# Patient Record
Sex: Female | Born: 1970
Health system: Southern US, Community
[De-identification: ages and names within clinical notes are randomized; demographics above are authoritative.]

## PROBLEM LIST (undated history)

## (undated) DIAGNOSIS — M1990 Unspecified osteoarthritis, unspecified site: Secondary | ICD-10-CM

---

## 2015-05-20 DIAGNOSIS — N649 Disorder of breast, unspecified: Secondary | ICD-10-CM | POA: Diagnosis not present

## 2015-05-20 DIAGNOSIS — Z3040 Encounter for surveillance of contraceptives, unspecified: Secondary | ICD-10-CM | POA: Diagnosis not present

## 2015-06-04 DIAGNOSIS — N6011 Diffuse cystic mastopathy of right breast: Secondary | ICD-10-CM | POA: Diagnosis not present

## 2015-06-04 DIAGNOSIS — N6012 Diffuse cystic mastopathy of left breast: Secondary | ICD-10-CM | POA: Diagnosis not present

## 2015-06-04 DIAGNOSIS — R922 Inconclusive mammogram: Secondary | ICD-10-CM | POA: Diagnosis not present

## 2015-06-24 DIAGNOSIS — M0589 Other rheumatoid arthritis with rheumatoid factor of multiple sites: Secondary | ICD-10-CM | POA: Diagnosis not present

## 2015-08-07 DIAGNOSIS — R42 Dizziness and giddiness: Secondary | ICD-10-CM | POA: Diagnosis not present

## 2015-08-07 DIAGNOSIS — R5383 Other fatigue: Secondary | ICD-10-CM | POA: Diagnosis not present

## 2015-08-07 DIAGNOSIS — R030 Elevated blood-pressure reading, without diagnosis of hypertension: Secondary | ICD-10-CM | POA: Diagnosis not present

## 2015-08-14 DIAGNOSIS — Z124 Encounter for screening for malignant neoplasm of cervix: Secondary | ICD-10-CM | POA: Diagnosis not present

## 2015-08-14 DIAGNOSIS — Z6826 Body mass index (BMI) 26.0-26.9, adult: Secondary | ICD-10-CM | POA: Diagnosis not present

## 2015-08-14 DIAGNOSIS — Z01419 Encounter for gynecological examination (general) (routine) without abnormal findings: Secondary | ICD-10-CM | POA: Diagnosis not present

## 2015-08-20 DIAGNOSIS — M0589 Other rheumatoid arthritis with rheumatoid factor of multiple sites: Secondary | ICD-10-CM | POA: Diagnosis not present

## 2015-08-27 DIAGNOSIS — M0579 Rheumatoid arthritis with rheumatoid factor of multiple sites without organ or systems involvement: Secondary | ICD-10-CM | POA: Diagnosis not present

## 2015-08-27 DIAGNOSIS — M7989 Other specified soft tissue disorders: Secondary | ICD-10-CM | POA: Diagnosis not present

## 2015-08-27 DIAGNOSIS — Z79899 Other long term (current) drug therapy: Secondary | ICD-10-CM | POA: Diagnosis not present

## 2015-09-11 DIAGNOSIS — R5383 Other fatigue: Secondary | ICD-10-CM | POA: Diagnosis not present

## 2015-10-07 DIAGNOSIS — R030 Elevated blood-pressure reading, without diagnosis of hypertension: Secondary | ICD-10-CM | POA: Diagnosis not present

## 2015-10-15 DIAGNOSIS — M0589 Other rheumatoid arthritis with rheumatoid factor of multiple sites: Secondary | ICD-10-CM | POA: Diagnosis not present

## 2015-10-16 DIAGNOSIS — Z79899 Other long term (current) drug therapy: Secondary | ICD-10-CM | POA: Diagnosis not present

## 2015-12-11 DIAGNOSIS — M0579 Rheumatoid arthritis with rheumatoid factor of multiple sites without organ or systems involvement: Secondary | ICD-10-CM | POA: Diagnosis not present

## 2015-12-11 DIAGNOSIS — Z79899 Other long term (current) drug therapy: Secondary | ICD-10-CM | POA: Diagnosis not present

## 2015-12-11 DIAGNOSIS — Z23 Encounter for immunization: Secondary | ICD-10-CM | POA: Diagnosis not present

## 2015-12-11 DIAGNOSIS — M7989 Other specified soft tissue disorders: Secondary | ICD-10-CM | POA: Diagnosis not present

## 2015-12-16 DIAGNOSIS — M0589 Other rheumatoid arthritis with rheumatoid factor of multiple sites: Secondary | ICD-10-CM | POA: Diagnosis not present

## 2015-12-16 DIAGNOSIS — Z79899 Other long term (current) drug therapy: Secondary | ICD-10-CM | POA: Diagnosis not present

## 2016-02-13 DIAGNOSIS — Z79899 Other long term (current) drug therapy: Secondary | ICD-10-CM | POA: Diagnosis not present

## 2016-02-13 DIAGNOSIS — M0589 Other rheumatoid arthritis with rheumatoid factor of multiple sites: Secondary | ICD-10-CM | POA: Diagnosis not present

## 2016-03-18 DIAGNOSIS — M0579 Rheumatoid arthritis with rheumatoid factor of multiple sites without organ or systems involvement: Secondary | ICD-10-CM | POA: Diagnosis not present

## 2016-03-18 DIAGNOSIS — Z79899 Other long term (current) drug therapy: Secondary | ICD-10-CM | POA: Diagnosis not present

## 2016-04-07 DIAGNOSIS — Z6826 Body mass index (BMI) 26.0-26.9, adult: Secondary | ICD-10-CM | POA: Diagnosis not present

## 2016-04-07 DIAGNOSIS — N760 Acute vaginitis: Secondary | ICD-10-CM | POA: Diagnosis not present

## 2016-04-13 DIAGNOSIS — Z79899 Other long term (current) drug therapy: Secondary | ICD-10-CM | POA: Diagnosis not present

## 2016-04-13 DIAGNOSIS — M0589 Other rheumatoid arthritis with rheumatoid factor of multiple sites: Secondary | ICD-10-CM | POA: Diagnosis not present

## 2016-04-20 DIAGNOSIS — Z0000 Encounter for general adult medical examination without abnormal findings: Secondary | ICD-10-CM | POA: Diagnosis not present

## 2016-04-20 DIAGNOSIS — M0589 Other rheumatoid arthritis with rheumatoid factor of multiple sites: Secondary | ICD-10-CM | POA: Diagnosis not present

## 2016-05-04 DIAGNOSIS — L309 Dermatitis, unspecified: Secondary | ICD-10-CM | POA: Diagnosis not present

## 2016-05-05 DIAGNOSIS — Z0000 Encounter for general adult medical examination without abnormal findings: Secondary | ICD-10-CM | POA: Diagnosis not present

## 2016-05-05 DIAGNOSIS — Z131 Encounter for screening for diabetes mellitus: Secondary | ICD-10-CM | POA: Diagnosis not present

## 2016-05-05 DIAGNOSIS — Z13220 Encounter for screening for lipoid disorders: Secondary | ICD-10-CM | POA: Diagnosis not present

## 2016-05-26 DIAGNOSIS — I8311 Varicose veins of right lower extremity with inflammation: Secondary | ICD-10-CM | POA: Diagnosis not present

## 2016-05-26 DIAGNOSIS — I8312 Varicose veins of left lower extremity with inflammation: Secondary | ICD-10-CM | POA: Diagnosis not present

## 2016-06-11 DIAGNOSIS — M0589 Other rheumatoid arthritis with rheumatoid factor of multiple sites: Secondary | ICD-10-CM | POA: Diagnosis not present

## 2016-06-22 DIAGNOSIS — I8311 Varicose veins of right lower extremity with inflammation: Secondary | ICD-10-CM | POA: Diagnosis not present

## 2016-06-22 DIAGNOSIS — I8312 Varicose veins of left lower extremity with inflammation: Secondary | ICD-10-CM | POA: Diagnosis not present

## 2016-07-07 DIAGNOSIS — I8311 Varicose veins of right lower extremity with inflammation: Secondary | ICD-10-CM | POA: Diagnosis not present

## 2016-07-07 DIAGNOSIS — I8312 Varicose veins of left lower extremity with inflammation: Secondary | ICD-10-CM | POA: Diagnosis not present

## 2016-07-27 DIAGNOSIS — M0579 Rheumatoid arthritis with rheumatoid factor of multiple sites without organ or systems involvement: Secondary | ICD-10-CM | POA: Diagnosis not present

## 2016-07-27 DIAGNOSIS — Z79899 Other long term (current) drug therapy: Secondary | ICD-10-CM | POA: Diagnosis not present

## 2016-07-27 DIAGNOSIS — M7989 Other specified soft tissue disorders: Secondary | ICD-10-CM | POA: Diagnosis not present

## 2016-08-26 DIAGNOSIS — Z79899 Other long term (current) drug therapy: Secondary | ICD-10-CM | POA: Diagnosis not present

## 2016-08-26 DIAGNOSIS — M0589 Other rheumatoid arthritis with rheumatoid factor of multiple sites: Secondary | ICD-10-CM | POA: Diagnosis not present

## 2016-09-14 DIAGNOSIS — Z6825 Body mass index (BMI) 25.0-25.9, adult: Secondary | ICD-10-CM | POA: Diagnosis not present

## 2016-09-14 DIAGNOSIS — Z124 Encounter for screening for malignant neoplasm of cervix: Secondary | ICD-10-CM | POA: Diagnosis not present

## 2016-09-14 DIAGNOSIS — Z01419 Encounter for gynecological examination (general) (routine) without abnormal findings: Secondary | ICD-10-CM | POA: Diagnosis not present

## 2016-10-06 DIAGNOSIS — Z1231 Encounter for screening mammogram for malignant neoplasm of breast: Secondary | ICD-10-CM | POA: Diagnosis not present

## 2016-10-14 DIAGNOSIS — L309 Dermatitis, unspecified: Secondary | ICD-10-CM | POA: Diagnosis not present

## 2016-10-14 DIAGNOSIS — Z23 Encounter for immunization: Secondary | ICD-10-CM | POA: Diagnosis not present

## 2016-10-14 DIAGNOSIS — L700 Acne vulgaris: Secondary | ICD-10-CM | POA: Diagnosis not present

## 2016-10-14 DIAGNOSIS — L74513 Primary focal hyperhidrosis, soles: Secondary | ICD-10-CM | POA: Diagnosis not present

## 2016-10-26 DIAGNOSIS — Z79899 Other long term (current) drug therapy: Secondary | ICD-10-CM | POA: Diagnosis not present

## 2016-10-26 DIAGNOSIS — M0579 Rheumatoid arthritis with rheumatoid factor of multiple sites without organ or systems involvement: Secondary | ICD-10-CM | POA: Diagnosis not present

## 2016-10-27 DIAGNOSIS — M0589 Other rheumatoid arthritis with rheumatoid factor of multiple sites: Secondary | ICD-10-CM | POA: Diagnosis not present

## 2016-10-27 DIAGNOSIS — Z79899 Other long term (current) drug therapy: Secondary | ICD-10-CM | POA: Diagnosis not present

## 2016-11-10 DIAGNOSIS — Z800 Family history of malignant neoplasm of digestive organs: Secondary | ICD-10-CM | POA: Diagnosis not present

## 2016-11-10 DIAGNOSIS — Z1211 Encounter for screening for malignant neoplasm of colon: Secondary | ICD-10-CM | POA: Diagnosis not present

## 2016-11-10 DIAGNOSIS — K5904 Chronic idiopathic constipation: Secondary | ICD-10-CM | POA: Diagnosis not present

## 2016-12-14 DIAGNOSIS — Z1211 Encounter for screening for malignant neoplasm of colon: Secondary | ICD-10-CM | POA: Diagnosis not present

## 2016-12-22 DIAGNOSIS — Z79899 Other long term (current) drug therapy: Secondary | ICD-10-CM | POA: Diagnosis not present

## 2016-12-22 DIAGNOSIS — M0589 Other rheumatoid arthritis with rheumatoid factor of multiple sites: Secondary | ICD-10-CM | POA: Diagnosis not present

## 2017-02-16 DIAGNOSIS — M0589 Other rheumatoid arthritis with rheumatoid factor of multiple sites: Secondary | ICD-10-CM | POA: Diagnosis not present

## 2017-02-16 DIAGNOSIS — Z79899 Other long term (current) drug therapy: Secondary | ICD-10-CM | POA: Diagnosis not present

## 2017-02-22 DIAGNOSIS — M0579 Rheumatoid arthritis with rheumatoid factor of multiple sites without organ or systems involvement: Secondary | ICD-10-CM | POA: Diagnosis not present

## 2017-02-22 DIAGNOSIS — Z79899 Other long term (current) drug therapy: Secondary | ICD-10-CM | POA: Diagnosis not present

## 2017-03-29 DIAGNOSIS — M65312 Trigger thumb, left thumb: Secondary | ICD-10-CM | POA: Diagnosis not present

## 2017-03-29 DIAGNOSIS — M79645 Pain in left finger(s): Secondary | ICD-10-CM | POA: Diagnosis not present

## 2017-04-12 DIAGNOSIS — M0589 Other rheumatoid arthritis with rheumatoid factor of multiple sites: Secondary | ICD-10-CM | POA: Diagnosis not present

## 2017-05-10 DIAGNOSIS — Z0000 Encounter for general adult medical examination without abnormal findings: Secondary | ICD-10-CM | POA: Diagnosis not present

## 2017-05-17 DIAGNOSIS — Z0000 Encounter for general adult medical examination without abnormal findings: Secondary | ICD-10-CM | POA: Diagnosis not present

## 2017-05-17 DIAGNOSIS — M0589 Other rheumatoid arthritis with rheumatoid factor of multiple sites: Secondary | ICD-10-CM | POA: Diagnosis not present

## 2017-05-17 DIAGNOSIS — M79674 Pain in right toe(s): Secondary | ICD-10-CM | POA: Diagnosis not present

## 2017-05-26 DIAGNOSIS — M79671 Pain in right foot: Secondary | ICD-10-CM | POA: Diagnosis not present

## 2017-05-26 DIAGNOSIS — M79672 Pain in left foot: Secondary | ICD-10-CM | POA: Diagnosis not present

## 2017-05-26 DIAGNOSIS — M2041 Other hammer toe(s) (acquired), right foot: Secondary | ICD-10-CM | POA: Diagnosis not present

## 2017-05-26 DIAGNOSIS — M2042 Other hammer toe(s) (acquired), left foot: Secondary | ICD-10-CM | POA: Diagnosis not present

## 2017-06-07 DIAGNOSIS — M0589 Other rheumatoid arthritis with rheumatoid factor of multiple sites: Secondary | ICD-10-CM | POA: Diagnosis not present

## 2017-06-07 DIAGNOSIS — Z79899 Other long term (current) drug therapy: Secondary | ICD-10-CM | POA: Diagnosis not present

## 2017-06-22 DIAGNOSIS — M0579 Rheumatoid arthritis with rheumatoid factor of multiple sites without organ or systems involvement: Secondary | ICD-10-CM | POA: Diagnosis not present

## 2017-06-22 DIAGNOSIS — Z79899 Other long term (current) drug therapy: Secondary | ICD-10-CM | POA: Diagnosis not present

## 2017-06-22 DIAGNOSIS — M24542 Contracture, left hand: Secondary | ICD-10-CM | POA: Diagnosis not present

## 2017-08-02 DIAGNOSIS — Z01419 Encounter for gynecological examination (general) (routine) without abnormal findings: Secondary | ICD-10-CM | POA: Diagnosis not present

## 2017-08-02 DIAGNOSIS — Z124 Encounter for screening for malignant neoplasm of cervix: Secondary | ICD-10-CM | POA: Diagnosis not present

## 2017-08-03 DIAGNOSIS — M0589 Other rheumatoid arthritis with rheumatoid factor of multiple sites: Secondary | ICD-10-CM | POA: Diagnosis not present

## 2017-08-03 DIAGNOSIS — Z79899 Other long term (current) drug therapy: Secondary | ICD-10-CM | POA: Diagnosis not present

## 2017-09-03 DIAGNOSIS — M79671 Pain in right foot: Secondary | ICD-10-CM | POA: Diagnosis not present

## 2017-09-28 DIAGNOSIS — Z79899 Other long term (current) drug therapy: Secondary | ICD-10-CM | POA: Diagnosis not present

## 2017-09-28 DIAGNOSIS — M0589 Other rheumatoid arthritis with rheumatoid factor of multiple sites: Secondary | ICD-10-CM | POA: Diagnosis not present

## 2017-11-01 DIAGNOSIS — Z23 Encounter for immunization: Secondary | ICD-10-CM | POA: Diagnosis not present

## 2017-11-01 DIAGNOSIS — M0579 Rheumatoid arthritis with rheumatoid factor of multiple sites without organ or systems involvement: Secondary | ICD-10-CM | POA: Diagnosis not present

## 2017-11-01 DIAGNOSIS — M24542 Contracture, left hand: Secondary | ICD-10-CM | POA: Diagnosis not present

## 2017-11-01 DIAGNOSIS — S6992XA Unspecified injury of left wrist, hand and finger(s), initial encounter: Secondary | ICD-10-CM | POA: Diagnosis not present

## 2017-11-25 DIAGNOSIS — M0589 Other rheumatoid arthritis with rheumatoid factor of multiple sites: Secondary | ICD-10-CM | POA: Diagnosis not present

## 2018-01-21 DIAGNOSIS — Z79899 Other long term (current) drug therapy: Secondary | ICD-10-CM | POA: Diagnosis not present

## 2018-01-21 DIAGNOSIS — M0589 Other rheumatoid arthritis with rheumatoid factor of multiple sites: Secondary | ICD-10-CM | POA: Diagnosis not present

## 2018-01-26 DIAGNOSIS — I8312 Varicose veins of left lower extremity with inflammation: Secondary | ICD-10-CM | POA: Diagnosis not present

## 2018-01-26 DIAGNOSIS — I8311 Varicose veins of right lower extremity with inflammation: Secondary | ICD-10-CM | POA: Diagnosis not present

## 2018-03-08 DIAGNOSIS — I8312 Varicose veins of left lower extremity with inflammation: Secondary | ICD-10-CM | POA: Diagnosis not present

## 2018-03-23 DIAGNOSIS — M0589 Other rheumatoid arthritis with rheumatoid factor of multiple sites: Secondary | ICD-10-CM | POA: Diagnosis not present

## 2018-04-22 DIAGNOSIS — M0579 Rheumatoid arthritis with rheumatoid factor of multiple sites without organ or systems involvement: Secondary | ICD-10-CM | POA: Diagnosis not present

## 2018-04-22 DIAGNOSIS — R5383 Other fatigue: Secondary | ICD-10-CM | POA: Diagnosis not present

## 2018-05-18 DIAGNOSIS — M0579 Rheumatoid arthritis with rheumatoid factor of multiple sites without organ or systems involvement: Secondary | ICD-10-CM | POA: Diagnosis not present

## 2018-05-18 DIAGNOSIS — Z79899 Other long term (current) drug therapy: Secondary | ICD-10-CM | POA: Diagnosis not present

## 2018-07-20 DIAGNOSIS — Z79899 Other long term (current) drug therapy: Secondary | ICD-10-CM | POA: Diagnosis not present

## 2018-07-20 DIAGNOSIS — M0589 Other rheumatoid arthritis with rheumatoid factor of multiple sites: Secondary | ICD-10-CM | POA: Diagnosis not present

## 2018-07-26 DIAGNOSIS — Z0000 Encounter for general adult medical examination without abnormal findings: Secondary | ICD-10-CM | POA: Diagnosis not present

## 2018-08-16 DIAGNOSIS — M0589 Other rheumatoid arthritis with rheumatoid factor of multiple sites: Secondary | ICD-10-CM | POA: Diagnosis not present

## 2018-08-16 DIAGNOSIS — Z7189 Other specified counseling: Secondary | ICD-10-CM | POA: Diagnosis not present

## 2018-08-16 DIAGNOSIS — R3915 Urgency of urination: Secondary | ICD-10-CM | POA: Diagnosis not present

## 2018-08-16 DIAGNOSIS — Z0000 Encounter for general adult medical examination without abnormal findings: Secondary | ICD-10-CM | POA: Diagnosis not present

## 2018-09-14 DIAGNOSIS — Z79899 Other long term (current) drug therapy: Secondary | ICD-10-CM | POA: Diagnosis not present

## 2018-09-14 DIAGNOSIS — M0589 Other rheumatoid arthritis with rheumatoid factor of multiple sites: Secondary | ICD-10-CM | POA: Diagnosis not present

## 2018-10-26 DIAGNOSIS — Z6826 Body mass index (BMI) 26.0-26.9, adult: Secondary | ICD-10-CM | POA: Diagnosis not present

## 2018-10-26 DIAGNOSIS — Z20828 Contact with and (suspected) exposure to other viral communicable diseases: Secondary | ICD-10-CM | POA: Diagnosis not present

## 2018-11-09 DIAGNOSIS — Z79899 Other long term (current) drug therapy: Secondary | ICD-10-CM | POA: Diagnosis not present

## 2018-11-09 DIAGNOSIS — M0589 Other rheumatoid arthritis with rheumatoid factor of multiple sites: Secondary | ICD-10-CM | POA: Diagnosis not present

## 2018-11-16 DIAGNOSIS — Z79899 Other long term (current) drug therapy: Secondary | ICD-10-CM | POA: Diagnosis not present

## 2018-11-16 DIAGNOSIS — M24542 Contracture, left hand: Secondary | ICD-10-CM | POA: Diagnosis not present

## 2018-11-16 DIAGNOSIS — Z23 Encounter for immunization: Secondary | ICD-10-CM | POA: Diagnosis not present

## 2018-11-16 DIAGNOSIS — M0579 Rheumatoid arthritis with rheumatoid factor of multiple sites without organ or systems involvement: Secondary | ICD-10-CM | POA: Diagnosis not present

## 2018-11-16 DIAGNOSIS — R5383 Other fatigue: Secondary | ICD-10-CM | POA: Diagnosis not present

## 2019-01-02 DIAGNOSIS — Z6827 Body mass index (BMI) 27.0-27.9, adult: Secondary | ICD-10-CM | POA: Diagnosis not present

## 2019-01-02 DIAGNOSIS — Z20828 Contact with and (suspected) exposure to other viral communicable diseases: Secondary | ICD-10-CM | POA: Diagnosis not present

## 2019-01-04 DIAGNOSIS — Z20828 Contact with and (suspected) exposure to other viral communicable diseases: Secondary | ICD-10-CM | POA: Diagnosis not present

## 2019-01-04 DIAGNOSIS — Z79899 Other long term (current) drug therapy: Secondary | ICD-10-CM | POA: Diagnosis not present

## 2019-01-04 DIAGNOSIS — M0589 Other rheumatoid arthritis with rheumatoid factor of multiple sites: Secondary | ICD-10-CM | POA: Diagnosis not present

## 2019-01-04 DIAGNOSIS — Z6827 Body mass index (BMI) 27.0-27.9, adult: Secondary | ICD-10-CM | POA: Diagnosis not present

## 2019-01-20 DIAGNOSIS — Z209 Contact with and (suspected) exposure to unspecified communicable disease: Secondary | ICD-10-CM | POA: Diagnosis not present

## 2019-01-20 DIAGNOSIS — Z6827 Body mass index (BMI) 27.0-27.9, adult: Secondary | ICD-10-CM | POA: Diagnosis not present

## 2019-02-14 DIAGNOSIS — Z6827 Body mass index (BMI) 27.0-27.9, adult: Secondary | ICD-10-CM | POA: Diagnosis not present

## 2019-02-14 DIAGNOSIS — Z20828 Contact with and (suspected) exposure to other viral communicable diseases: Secondary | ICD-10-CM | POA: Diagnosis not present

## 2019-03-07 DIAGNOSIS — M0589 Other rheumatoid arthritis with rheumatoid factor of multiple sites: Secondary | ICD-10-CM | POA: Diagnosis not present

## 2019-05-02 DIAGNOSIS — M0589 Other rheumatoid arthritis with rheumatoid factor of multiple sites: Secondary | ICD-10-CM | POA: Diagnosis not present

## 2019-05-17 DIAGNOSIS — M24542 Contracture, left hand: Secondary | ICD-10-CM | POA: Diagnosis not present

## 2019-05-17 DIAGNOSIS — M0579 Rheumatoid arthritis with rheumatoid factor of multiple sites without organ or systems involvement: Secondary | ICD-10-CM | POA: Diagnosis not present

## 2019-05-22 DIAGNOSIS — M0579 Rheumatoid arthritis with rheumatoid factor of multiple sites without organ or systems involvement: Secondary | ICD-10-CM | POA: Diagnosis not present

## 2019-05-30 DIAGNOSIS — Z20822 Contact with and (suspected) exposure to covid-19: Secondary | ICD-10-CM | POA: Diagnosis not present

## 2019-06-28 DIAGNOSIS — Z79899 Other long term (current) drug therapy: Secondary | ICD-10-CM | POA: Diagnosis not present

## 2019-06-28 DIAGNOSIS — M0579 Rheumatoid arthritis with rheumatoid factor of multiple sites without organ or systems involvement: Secondary | ICD-10-CM | POA: Diagnosis not present

## 2019-07-03 DIAGNOSIS — R5383 Other fatigue: Secondary | ICD-10-CM | POA: Diagnosis not present

## 2019-07-11 DIAGNOSIS — R432 Parageusia: Secondary | ICD-10-CM | POA: Diagnosis not present

## 2019-07-11 DIAGNOSIS — R430 Anosmia: Secondary | ICD-10-CM | POA: Diagnosis not present

## 2019-07-11 DIAGNOSIS — R0981 Nasal congestion: Secondary | ICD-10-CM | POA: Diagnosis not present

## 2019-08-29 DIAGNOSIS — Z79899 Other long term (current) drug therapy: Secondary | ICD-10-CM | POA: Diagnosis not present

## 2019-08-29 DIAGNOSIS — M0579 Rheumatoid arthritis with rheumatoid factor of multiple sites without organ or systems involvement: Secondary | ICD-10-CM | POA: Diagnosis not present

## 2019-10-18 DIAGNOSIS — Z0000 Encounter for general adult medical examination without abnormal findings: Secondary | ICD-10-CM | POA: Diagnosis not present

## 2019-10-18 DIAGNOSIS — Z131 Encounter for screening for diabetes mellitus: Secondary | ICD-10-CM | POA: Diagnosis not present

## 2019-10-18 DIAGNOSIS — E875 Hyperkalemia: Secondary | ICD-10-CM | POA: Diagnosis not present

## 2019-10-20 DIAGNOSIS — E875 Hyperkalemia: Secondary | ICD-10-CM | POA: Diagnosis not present

## 2019-10-23 DIAGNOSIS — Z20828 Contact with and (suspected) exposure to other viral communicable diseases: Secondary | ICD-10-CM | POA: Diagnosis not present

## 2019-10-24 DIAGNOSIS — M0579 Rheumatoid arthritis with rheumatoid factor of multiple sites without organ or systems involvement: Secondary | ICD-10-CM | POA: Diagnosis not present

## 2019-10-24 DIAGNOSIS — Z79899 Other long term (current) drug therapy: Secondary | ICD-10-CM | POA: Diagnosis not present

## 2019-10-25 DIAGNOSIS — M7072 Other bursitis of hip, left hip: Secondary | ICD-10-CM | POA: Diagnosis not present

## 2019-10-25 DIAGNOSIS — R430 Anosmia: Secondary | ICD-10-CM | POA: Diagnosis not present

## 2019-10-25 DIAGNOSIS — I872 Venous insufficiency (chronic) (peripheral): Secondary | ICD-10-CM | POA: Diagnosis not present

## 2019-10-25 DIAGNOSIS — M0589 Other rheumatoid arthritis with rheumatoid factor of multiple sites: Secondary | ICD-10-CM | POA: Diagnosis not present

## 2019-10-25 DIAGNOSIS — Z0000 Encounter for general adult medical examination without abnormal findings: Secondary | ICD-10-CM | POA: Diagnosis not present

## 2019-11-13 DIAGNOSIS — R430 Anosmia: Secondary | ICD-10-CM | POA: Diagnosis not present

## 2019-11-13 DIAGNOSIS — R432 Parageusia: Secondary | ICD-10-CM | POA: Diagnosis not present

## 2019-11-17 DIAGNOSIS — R430 Anosmia: Secondary | ICD-10-CM

## 2019-11-20 DIAGNOSIS — M24542 Contracture, left hand: Secondary | ICD-10-CM | POA: Diagnosis not present

## 2019-11-20 DIAGNOSIS — R5383 Other fatigue: Secondary | ICD-10-CM | POA: Diagnosis not present

## 2019-11-20 DIAGNOSIS — M0579 Rheumatoid arthritis with rheumatoid factor of multiple sites without organ or systems involvement: Secondary | ICD-10-CM | POA: Diagnosis not present

## 2019-11-20 DIAGNOSIS — Z79899 Other long term (current) drug therapy: Secondary | ICD-10-CM | POA: Diagnosis not present

## 2019-12-20 DIAGNOSIS — M0589 Other rheumatoid arthritis with rheumatoid factor of multiple sites: Secondary | ICD-10-CM | POA: Diagnosis not present

## 2020-02-16 DIAGNOSIS — M0589 Other rheumatoid arthritis with rheumatoid factor of multiple sites: Secondary | ICD-10-CM | POA: Diagnosis not present

## 2020-03-04 DIAGNOSIS — T7840XA Allergy, unspecified, initial encounter: Secondary | ICD-10-CM | POA: Diagnosis not present

## 2020-03-18 DIAGNOSIS — R200 Anesthesia of skin: Secondary | ICD-10-CM | POA: Diagnosis not present

## 2020-03-18 DIAGNOSIS — B353 Tinea pedis: Secondary | ICD-10-CM | POA: Diagnosis not present

## 2020-03-18 DIAGNOSIS — M06252 Rheumatoid bursitis, left hip: Secondary | ICD-10-CM | POA: Diagnosis not present

## 2020-03-18 DIAGNOSIS — I872 Venous insufficiency (chronic) (peripheral): Secondary | ICD-10-CM | POA: Diagnosis not present

## 2020-03-26 DIAGNOSIS — S161XXA Strain of muscle, fascia and tendon at neck level, initial encounter: Secondary | ICD-10-CM

## 2020-03-26 HISTORY — DX: Unspecified osteoarthritis, unspecified site: M19.90

## 2020-03-26 NOTE — Discharge Instructions (Signed)
Naprosyn twice daily for neck pain/headache Supplement with tizanidine at home- may cause drowsiness Alternate ice and heat Follow up if not improving
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
## 2020-04-01 DIAGNOSIS — M5459 Other low back pain: Secondary | ICD-10-CM | POA: Diagnosis not present

## 2020-04-01 DIAGNOSIS — M542 Cervicalgia: Secondary | ICD-10-CM | POA: Diagnosis not present

## 2020-04-08 DIAGNOSIS — M542 Cervicalgia: Secondary | ICD-10-CM

## 2020-04-10 DIAGNOSIS — M542 Cervicalgia: Secondary | ICD-10-CM | POA: Diagnosis not present

## 2020-04-12 DIAGNOSIS — M0589 Other rheumatoid arthritis with rheumatoid factor of multiple sites: Secondary | ICD-10-CM | POA: Diagnosis not present

## 2020-04-15 DIAGNOSIS — M542 Cervicalgia: Secondary | ICD-10-CM | POA: Diagnosis not present

## 2020-04-16 DIAGNOSIS — Z20822 Contact with and (suspected) exposure to covid-19: Secondary | ICD-10-CM | POA: Diagnosis not present

## 2020-04-25 DIAGNOSIS — M542 Cervicalgia: Secondary | ICD-10-CM | POA: Diagnosis not present

## 2020-05-01 DIAGNOSIS — M542 Cervicalgia: Secondary | ICD-10-CM | POA: Diagnosis not present

## 2020-05-10 DIAGNOSIS — M542 Cervicalgia: Secondary | ICD-10-CM | POA: Diagnosis not present

## 2020-05-15 DIAGNOSIS — M542 Cervicalgia: Secondary | ICD-10-CM | POA: Diagnosis not present

## 2020-05-22 DIAGNOSIS — M542 Cervicalgia: Secondary | ICD-10-CM | POA: Diagnosis not present

## 2020-06-03 DIAGNOSIS — M542 Cervicalgia: Secondary | ICD-10-CM | POA: Diagnosis not present

## 2020-06-04 DIAGNOSIS — Z20822 Contact with and (suspected) exposure to covid-19: Secondary | ICD-10-CM | POA: Diagnosis not present

## 2020-06-06 DIAGNOSIS — M542 Cervicalgia: Secondary | ICD-10-CM | POA: Diagnosis not present

## 2020-06-10 DIAGNOSIS — M0589 Other rheumatoid arthritis with rheumatoid factor of multiple sites: Secondary | ICD-10-CM | POA: Diagnosis not present

## 2020-06-25 DIAGNOSIS — Z79899 Other long term (current) drug therapy: Secondary | ICD-10-CM | POA: Diagnosis not present

## 2020-06-25 DIAGNOSIS — R5383 Other fatigue: Secondary | ICD-10-CM | POA: Diagnosis not present

## 2020-06-25 DIAGNOSIS — M0579 Rheumatoid arthritis with rheumatoid factor of multiple sites without organ or systems involvement: Secondary | ICD-10-CM | POA: Diagnosis not present

## 2020-06-25 DIAGNOSIS — M24542 Contracture, left hand: Secondary | ICD-10-CM | POA: Diagnosis not present

## 2020-06-28 DIAGNOSIS — Z01411 Encounter for gynecological examination (general) (routine) with abnormal findings: Secondary | ICD-10-CM | POA: Diagnosis not present

## 2020-06-28 DIAGNOSIS — Z01419 Encounter for gynecological examination (general) (routine) without abnormal findings: Secondary | ICD-10-CM | POA: Diagnosis not present

## 2020-06-28 DIAGNOSIS — Z124 Encounter for screening for malignant neoplasm of cervix: Secondary | ICD-10-CM | POA: Diagnosis not present

## 2020-06-28 DIAGNOSIS — Z6826 Body mass index (BMI) 26.0-26.9, adult: Secondary | ICD-10-CM | POA: Diagnosis not present

## 2020-06-28 DIAGNOSIS — Z1231 Encounter for screening mammogram for malignant neoplasm of breast: Secondary | ICD-10-CM | POA: Diagnosis not present

## 2020-08-05 DIAGNOSIS — Z111 Encounter for screening for respiratory tuberculosis: Secondary | ICD-10-CM | POA: Diagnosis not present

## 2020-08-05 DIAGNOSIS — M0579 Rheumatoid arthritis with rheumatoid factor of multiple sites without organ or systems involvement: Secondary | ICD-10-CM | POA: Diagnosis not present

## 2020-08-05 DIAGNOSIS — M0589 Other rheumatoid arthritis with rheumatoid factor of multiple sites: Secondary | ICD-10-CM | POA: Diagnosis not present

## 2020-08-05 DIAGNOSIS — Z79899 Other long term (current) drug therapy: Secondary | ICD-10-CM | POA: Diagnosis not present

## 2020-08-05 DIAGNOSIS — R5383 Other fatigue: Secondary | ICD-10-CM | POA: Diagnosis not present

## 2020-09-30 DIAGNOSIS — M0579 Rheumatoid arthritis with rheumatoid factor of multiple sites without organ or systems involvement: Secondary | ICD-10-CM | POA: Diagnosis not present

## 2020-09-30 DIAGNOSIS — Z79899 Other long term (current) drug therapy: Secondary | ICD-10-CM | POA: Diagnosis not present

## 2020-11-18 DIAGNOSIS — Z0000 Encounter for general adult medical examination without abnormal findings: Secondary | ICD-10-CM | POA: Diagnosis not present

## 2020-11-25 DIAGNOSIS — D72819 Decreased white blood cell count, unspecified: Secondary | ICD-10-CM | POA: Diagnosis not present

## 2020-11-25 DIAGNOSIS — Z0000 Encounter for general adult medical examination without abnormal findings: Secondary | ICD-10-CM | POA: Diagnosis not present

## 2020-11-25 DIAGNOSIS — M0589 Other rheumatoid arthritis with rheumatoid factor of multiple sites: Secondary | ICD-10-CM | POA: Diagnosis not present

## 2020-11-25 DIAGNOSIS — R21 Rash and other nonspecific skin eruption: Secondary | ICD-10-CM | POA: Diagnosis not present

## 2020-11-26 DIAGNOSIS — M0589 Other rheumatoid arthritis with rheumatoid factor of multiple sites: Secondary | ICD-10-CM | POA: Diagnosis not present

## 2020-12-09 DIAGNOSIS — J309 Allergic rhinitis, unspecified: Secondary | ICD-10-CM | POA: Diagnosis not present

## 2020-12-24 DIAGNOSIS — M0579 Rheumatoid arthritis with rheumatoid factor of multiple sites without organ or systems involvement: Secondary | ICD-10-CM | POA: Diagnosis not present

## 2020-12-24 DIAGNOSIS — M24542 Contracture, left hand: Secondary | ICD-10-CM | POA: Diagnosis not present

## 2020-12-24 DIAGNOSIS — Z79899 Other long term (current) drug therapy: Secondary | ICD-10-CM | POA: Diagnosis not present

## 2020-12-24 DIAGNOSIS — M25552 Pain in left hip: Secondary | ICD-10-CM | POA: Diagnosis not present

## 2021-01-15 DIAGNOSIS — R509 Fever, unspecified: Secondary | ICD-10-CM | POA: Diagnosis not present

## 2021-01-16 DIAGNOSIS — Z20822 Contact with and (suspected) exposure to covid-19: Secondary | ICD-10-CM | POA: Diagnosis not present

## 2021-01-22 DIAGNOSIS — Z79899 Other long term (current) drug therapy: Secondary | ICD-10-CM | POA: Diagnosis not present

## 2021-01-22 DIAGNOSIS — M0589 Other rheumatoid arthritis with rheumatoid factor of multiple sites: Secondary | ICD-10-CM | POA: Diagnosis not present

## 2021-01-27 DIAGNOSIS — R350 Frequency of micturition: Secondary | ICD-10-CM | POA: Diagnosis not present

## 2021-03-20 DIAGNOSIS — M0589 Other rheumatoid arthritis with rheumatoid factor of multiple sites: Secondary | ICD-10-CM | POA: Diagnosis not present

## 2021-03-20 DIAGNOSIS — Z79899 Other long term (current) drug therapy: Secondary | ICD-10-CM | POA: Diagnosis not present

## 2021-04-11 DIAGNOSIS — R058 Other specified cough: Secondary | ICD-10-CM | POA: Diagnosis not present

## 2021-04-11 DIAGNOSIS — J40 Bronchitis, not specified as acute or chronic: Secondary | ICD-10-CM | POA: Diagnosis not present

## 2021-05-08 DIAGNOSIS — N952 Postmenopausal atrophic vaginitis: Secondary | ICD-10-CM | POA: Diagnosis not present

## 2021-05-08 DIAGNOSIS — N951 Menopausal and female climacteric states: Secondary | ICD-10-CM | POA: Diagnosis not present

## 2021-05-19 DIAGNOSIS — M0589 Other rheumatoid arthritis with rheumatoid factor of multiple sites: Secondary | ICD-10-CM | POA: Diagnosis not present

## 2021-05-19 DIAGNOSIS — Z79899 Other long term (current) drug therapy: Secondary | ICD-10-CM | POA: Diagnosis not present

## 2021-06-25 DIAGNOSIS — M25552 Pain in left hip: Secondary | ICD-10-CM | POA: Diagnosis not present

## 2021-06-25 DIAGNOSIS — M24542 Contracture, left hand: Secondary | ICD-10-CM | POA: Diagnosis not present

## 2021-06-25 DIAGNOSIS — M0579 Rheumatoid arthritis with rheumatoid factor of multiple sites without organ or systems involvement: Secondary | ICD-10-CM | POA: Diagnosis not present

## 2021-06-25 DIAGNOSIS — Z79899 Other long term (current) drug therapy: Secondary | ICD-10-CM | POA: Diagnosis not present

## 2021-07-29 DIAGNOSIS — M0589 Other rheumatoid arthritis with rheumatoid factor of multiple sites: Secondary | ICD-10-CM | POA: Diagnosis not present

## 2021-07-29 DIAGNOSIS — Z79899 Other long term (current) drug therapy: Secondary | ICD-10-CM | POA: Diagnosis not present

## 2021-08-04 DIAGNOSIS — Z6828 Body mass index (BMI) 28.0-28.9, adult: Secondary | ICD-10-CM | POA: Diagnosis not present

## 2021-08-04 DIAGNOSIS — Z01419 Encounter for gynecological examination (general) (routine) without abnormal findings: Secondary | ICD-10-CM | POA: Diagnosis not present

## 2021-08-04 DIAGNOSIS — Z1231 Encounter for screening mammogram for malignant neoplasm of breast: Secondary | ICD-10-CM | POA: Diagnosis not present

## 2021-08-27 DIAGNOSIS — B351 Tinea unguium: Secondary | ICD-10-CM | POA: Diagnosis not present

## 2021-08-27 DIAGNOSIS — B353 Tinea pedis: Secondary | ICD-10-CM | POA: Diagnosis not present

## 2021-08-27 DIAGNOSIS — L910 Hypertrophic scar: Secondary | ICD-10-CM | POA: Diagnosis not present

## 2021-10-20 DIAGNOSIS — M0589 Other rheumatoid arthritis with rheumatoid factor of multiple sites: Secondary | ICD-10-CM | POA: Diagnosis not present

## 2021-10-20 DIAGNOSIS — Z79899 Other long term (current) drug therapy: Secondary | ICD-10-CM | POA: Diagnosis not present

## 2021-10-20 DIAGNOSIS — R5383 Other fatigue: Secondary | ICD-10-CM | POA: Diagnosis not present

## 2021-10-27 DIAGNOSIS — L810 Postinflammatory hyperpigmentation: Secondary | ICD-10-CM | POA: Diagnosis not present

## 2021-10-27 DIAGNOSIS — L910 Hypertrophic scar: Secondary | ICD-10-CM | POA: Diagnosis not present

## 2021-12-16 DIAGNOSIS — M0589 Other rheumatoid arthritis with rheumatoid factor of multiple sites: Secondary | ICD-10-CM | POA: Diagnosis not present

## 2021-12-16 DIAGNOSIS — Z79899 Other long term (current) drug therapy: Secondary | ICD-10-CM | POA: Diagnosis not present

## 2021-12-23 DIAGNOSIS — M0589 Other rheumatoid arthritis with rheumatoid factor of multiple sites: Secondary | ICD-10-CM | POA: Diagnosis not present

## 2021-12-23 DIAGNOSIS — Z0000 Encounter for general adult medical examination without abnormal findings: Secondary | ICD-10-CM | POA: Diagnosis not present

## 2021-12-23 DIAGNOSIS — D72819 Decreased white blood cell count, unspecified: Secondary | ICD-10-CM | POA: Diagnosis not present

## 2021-12-29 DIAGNOSIS — M24542 Contracture, left hand: Secondary | ICD-10-CM | POA: Diagnosis not present

## 2021-12-29 DIAGNOSIS — M25552 Pain in left hip: Secondary | ICD-10-CM | POA: Diagnosis not present

## 2021-12-29 DIAGNOSIS — Z79899 Other long term (current) drug therapy: Secondary | ICD-10-CM | POA: Diagnosis not present

## 2021-12-29 DIAGNOSIS — M0579 Rheumatoid arthritis with rheumatoid factor of multiple sites without organ or systems involvement: Secondary | ICD-10-CM | POA: Diagnosis not present

## 2021-12-30 DIAGNOSIS — F418 Other specified anxiety disorders: Secondary | ICD-10-CM | POA: Diagnosis not present

## 2021-12-30 DIAGNOSIS — D72819 Decreased white blood cell count, unspecified: Secondary | ICD-10-CM | POA: Diagnosis not present

## 2021-12-30 DIAGNOSIS — B379 Candidiasis, unspecified: Secondary | ICD-10-CM | POA: Diagnosis not present

## 2021-12-30 DIAGNOSIS — Z0000 Encounter for general adult medical examination without abnormal findings: Secondary | ICD-10-CM | POA: Diagnosis not present

## 2021-12-30 DIAGNOSIS — F419 Anxiety disorder, unspecified: Secondary | ICD-10-CM | POA: Diagnosis not present

## 2021-12-30 DIAGNOSIS — Z23 Encounter for immunization: Secondary | ICD-10-CM | POA: Diagnosis not present

## 2022-02-11 DIAGNOSIS — M0589 Other rheumatoid arthritis with rheumatoid factor of multiple sites: Secondary | ICD-10-CM | POA: Diagnosis not present

## 2022-02-16 DIAGNOSIS — F419 Anxiety disorder, unspecified: Secondary | ICD-10-CM | POA: Diagnosis not present

## 2022-04-01 DIAGNOSIS — N951 Menopausal and female climacteric states: Secondary | ICD-10-CM | POA: Diagnosis not present

## 2022-04-01 DIAGNOSIS — Z79890 Hormone replacement therapy (postmenopausal): Secondary | ICD-10-CM | POA: Diagnosis not present

## 2022-04-01 DIAGNOSIS — B3731 Acute candidiasis of vulva and vagina: Secondary | ICD-10-CM | POA: Diagnosis not present

## 2022-04-01 DIAGNOSIS — N898 Other specified noninflammatory disorders of vagina: Secondary | ICD-10-CM | POA: Diagnosis not present

## 2022-04-08 DIAGNOSIS — Z79899 Other long term (current) drug therapy: Secondary | ICD-10-CM | POA: Diagnosis not present

## 2022-04-08 DIAGNOSIS — M0589 Other rheumatoid arthritis with rheumatoid factor of multiple sites: Secondary | ICD-10-CM | POA: Diagnosis not present
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
## 2022-06-08 DIAGNOSIS — Z79899 Other long term (current) drug therapy: Secondary | ICD-10-CM | POA: Diagnosis not present

## 2022-06-08 DIAGNOSIS — M0589 Other rheumatoid arthritis with rheumatoid factor of multiple sites: Secondary | ICD-10-CM | POA: Diagnosis not present

## 2022-06-09 DIAGNOSIS — L910 Hypertrophic scar: Secondary | ICD-10-CM | POA: Diagnosis not present

## 2022-06-09 DIAGNOSIS — L810 Postinflammatory hyperpigmentation: Secondary | ICD-10-CM | POA: Diagnosis not present

## 2022-06-09 DIAGNOSIS — L84 Corns and callosities: Secondary | ICD-10-CM | POA: Diagnosis not present

## 2022-06-22 DIAGNOSIS — M7070 Other bursitis of hip, unspecified hip: Secondary | ICD-10-CM | POA: Diagnosis not present

## 2022-06-25 DIAGNOSIS — M25552 Pain in left hip: Secondary | ICD-10-CM | POA: Diagnosis not present

## 2022-06-25 DIAGNOSIS — Z6828 Body mass index (BMI) 28.0-28.9, adult: Secondary | ICD-10-CM | POA: Diagnosis not present

## 2022-06-25 DIAGNOSIS — M0579 Rheumatoid arthritis with rheumatoid factor of multiple sites without organ or systems involvement: Secondary | ICD-10-CM | POA: Diagnosis not present

## 2022-08-03 DIAGNOSIS — Z79899 Other long term (current) drug therapy: Secondary | ICD-10-CM | POA: Diagnosis not present

## 2022-08-03 DIAGNOSIS — M0589 Other rheumatoid arthritis with rheumatoid factor of multiple sites: Secondary | ICD-10-CM | POA: Diagnosis not present

## 2022-08-04 DIAGNOSIS — M6281 Muscle weakness (generalized): Secondary | ICD-10-CM | POA: Diagnosis not present

## 2022-08-04 DIAGNOSIS — M25552 Pain in left hip: Secondary | ICD-10-CM | POA: Diagnosis not present

## 2022-08-04 DIAGNOSIS — M25562 Pain in left knee: Secondary | ICD-10-CM | POA: Diagnosis not present

## 2022-08-06 DIAGNOSIS — M7989 Other specified soft tissue disorders: Secondary | ICD-10-CM

## 2022-08-06 DIAGNOSIS — M79609 Pain in unspecified limb: Secondary | ICD-10-CM | POA: Diagnosis not present

## 2022-08-06 DIAGNOSIS — M7122 Synovial cyst of popliteal space [Baker], left knee: Secondary | ICD-10-CM | POA: Diagnosis not present

## 2022-08-06 DIAGNOSIS — M25562 Pain in left knee: Secondary | ICD-10-CM

## 2022-08-06 DIAGNOSIS — M25552 Pain in left hip: Secondary | ICD-10-CM | POA: Diagnosis not present

## 2022-08-06 DIAGNOSIS — M0579 Rheumatoid arthritis with rheumatoid factor of multiple sites without organ or systems involvement: Secondary | ICD-10-CM | POA: Diagnosis not present

## 2022-08-06 DIAGNOSIS — Z79899 Other long term (current) drug therapy: Secondary | ICD-10-CM | POA: Diagnosis not present

## 2022-08-06 DIAGNOSIS — R229 Localized swelling, mass and lump, unspecified: Secondary | ICD-10-CM | POA: Diagnosis not present

## 2022-08-06 DIAGNOSIS — M24542 Contracture, left hand: Secondary | ICD-10-CM | POA: Diagnosis not present

## 2022-08-07 DIAGNOSIS — M6281 Muscle weakness (generalized): Secondary | ICD-10-CM | POA: Diagnosis not present

## 2022-08-07 DIAGNOSIS — M25552 Pain in left hip: Secondary | ICD-10-CM | POA: Diagnosis not present

## 2022-08-07 DIAGNOSIS — M25562 Pain in left knee: Secondary | ICD-10-CM | POA: Diagnosis not present

## 2022-08-19 DIAGNOSIS — M25552 Pain in left hip: Secondary | ICD-10-CM | POA: Diagnosis not present

## 2022-08-19 DIAGNOSIS — M6281 Muscle weakness (generalized): Secondary | ICD-10-CM | POA: Diagnosis not present

## 2022-08-19 DIAGNOSIS — M25562 Pain in left knee: Secondary | ICD-10-CM | POA: Diagnosis not present

## 2022-08-21 DIAGNOSIS — M6281 Muscle weakness (generalized): Secondary | ICD-10-CM | POA: Diagnosis not present

## 2022-08-21 DIAGNOSIS — M25552 Pain in left hip: Secondary | ICD-10-CM | POA: Diagnosis not present

## 2022-08-21 DIAGNOSIS — M25562 Pain in left knee: Secondary | ICD-10-CM | POA: Diagnosis not present

## 2022-08-25 DIAGNOSIS — M6281 Muscle weakness (generalized): Secondary | ICD-10-CM | POA: Diagnosis not present

## 2022-08-25 DIAGNOSIS — M25562 Pain in left knee: Secondary | ICD-10-CM | POA: Diagnosis not present

## 2022-08-25 DIAGNOSIS — M25552 Pain in left hip: Secondary | ICD-10-CM | POA: Diagnosis not present

## 2022-08-26 DIAGNOSIS — M24542 Contracture, left hand: Secondary | ICD-10-CM | POA: Diagnosis not present

## 2022-08-26 DIAGNOSIS — Z79899 Other long term (current) drug therapy: Secondary | ICD-10-CM | POA: Diagnosis not present

## 2022-08-26 DIAGNOSIS — M25562 Pain in left knee: Secondary | ICD-10-CM | POA: Diagnosis not present

## 2022-08-26 DIAGNOSIS — M25552 Pain in left hip: Secondary | ICD-10-CM | POA: Diagnosis not present

## 2022-08-26 DIAGNOSIS — M0579 Rheumatoid arthritis with rheumatoid factor of multiple sites without organ or systems involvement: Secondary | ICD-10-CM | POA: Diagnosis not present

## 2022-08-28 DIAGNOSIS — M25562 Pain in left knee: Secondary | ICD-10-CM | POA: Diagnosis not present

## 2022-08-28 DIAGNOSIS — M6281 Muscle weakness (generalized): Secondary | ICD-10-CM | POA: Diagnosis not present

## 2022-08-28 DIAGNOSIS — M25552 Pain in left hip: Secondary | ICD-10-CM | POA: Diagnosis not present

## 2022-09-04 DIAGNOSIS — M6281 Muscle weakness (generalized): Secondary | ICD-10-CM | POA: Diagnosis not present

## 2022-09-04 DIAGNOSIS — M25562 Pain in left knee: Secondary | ICD-10-CM | POA: Diagnosis not present

## 2022-09-04 DIAGNOSIS — M25552 Pain in left hip: Secondary | ICD-10-CM | POA: Diagnosis not present

## 2022-09-08 DIAGNOSIS — M6281 Muscle weakness (generalized): Secondary | ICD-10-CM | POA: Diagnosis not present

## 2022-09-08 DIAGNOSIS — M25552 Pain in left hip: Secondary | ICD-10-CM | POA: Diagnosis not present

## 2022-09-08 DIAGNOSIS — M25562 Pain in left knee: Secondary | ICD-10-CM | POA: Diagnosis not present

## 2022-09-11 DIAGNOSIS — M25562 Pain in left knee: Secondary | ICD-10-CM | POA: Diagnosis not present

## 2022-09-11 DIAGNOSIS — M6281 Muscle weakness (generalized): Secondary | ICD-10-CM | POA: Diagnosis not present

## 2022-09-11 DIAGNOSIS — M25552 Pain in left hip: Secondary | ICD-10-CM | POA: Diagnosis not present

## 2022-09-18 DIAGNOSIS — M6281 Muscle weakness (generalized): Secondary | ICD-10-CM | POA: Diagnosis not present

## 2022-09-18 DIAGNOSIS — M25552 Pain in left hip: Secondary | ICD-10-CM | POA: Diagnosis not present

## 2022-09-18 DIAGNOSIS — M25562 Pain in left knee: Secondary | ICD-10-CM | POA: Diagnosis not present

## 2022-10-05 DIAGNOSIS — M6281 Muscle weakness (generalized): Secondary | ICD-10-CM | POA: Diagnosis not present

## 2022-10-05 DIAGNOSIS — M0589 Other rheumatoid arthritis with rheumatoid factor of multiple sites: Secondary | ICD-10-CM | POA: Diagnosis not present

## 2022-10-05 DIAGNOSIS — Z111 Encounter for screening for respiratory tuberculosis: Secondary | ICD-10-CM | POA: Diagnosis not present

## 2022-10-05 DIAGNOSIS — M25562 Pain in left knee: Secondary | ICD-10-CM | POA: Diagnosis not present

## 2022-10-05 DIAGNOSIS — Z79899 Other long term (current) drug therapy: Secondary | ICD-10-CM | POA: Diagnosis not present

## 2022-10-05 DIAGNOSIS — R5383 Other fatigue: Secondary | ICD-10-CM | POA: Diagnosis not present

## 2022-10-05 DIAGNOSIS — M25552 Pain in left hip: Secondary | ICD-10-CM | POA: Diagnosis not present

## 2022-10-14 DIAGNOSIS — M542 Cervicalgia: Secondary | ICD-10-CM | POA: Diagnosis not present

## 2022-10-14 DIAGNOSIS — G8929 Other chronic pain: Secondary | ICD-10-CM | POA: Diagnosis not present

## 2022-10-14 DIAGNOSIS — M25511 Pain in right shoulder: Secondary | ICD-10-CM | POA: Diagnosis not present

## 2022-10-14 DIAGNOSIS — M25569 Pain in unspecified knee: Secondary | ICD-10-CM | POA: Diagnosis not present

## 2022-10-16 DIAGNOSIS — M25552 Pain in left hip: Secondary | ICD-10-CM | POA: Diagnosis not present

## 2022-10-16 DIAGNOSIS — M25562 Pain in left knee: Secondary | ICD-10-CM | POA: Diagnosis not present

## 2022-10-16 DIAGNOSIS — M6281 Muscle weakness (generalized): Secondary | ICD-10-CM | POA: Diagnosis not present

## 2022-10-21 DIAGNOSIS — M25552 Pain in left hip: Secondary | ICD-10-CM | POA: Diagnosis not present

## 2022-10-21 DIAGNOSIS — M25562 Pain in left knee: Secondary | ICD-10-CM | POA: Diagnosis not present

## 2022-10-21 DIAGNOSIS — M6281 Muscle weakness (generalized): Secondary | ICD-10-CM | POA: Diagnosis not present

## 2022-11-04 DIAGNOSIS — N951 Menopausal and female climacteric states: Secondary | ICD-10-CM | POA: Diagnosis not present

## 2022-11-04 DIAGNOSIS — Z1329 Encounter for screening for other suspected endocrine disorder: Secondary | ICD-10-CM | POA: Diagnosis not present

## 2022-11-12 DIAGNOSIS — M0520 Rheumatoid vasculitis with rheumatoid arthritis of unspecified site: Secondary | ICD-10-CM | POA: Diagnosis not present

## 2022-11-12 DIAGNOSIS — F32A Depression, unspecified: Secondary | ICD-10-CM | POA: Diagnosis not present

## 2022-11-12 DIAGNOSIS — Z6828 Body mass index (BMI) 28.0-28.9, adult: Secondary | ICD-10-CM | POA: Diagnosis not present

## 2022-11-12 DIAGNOSIS — N951 Menopausal and female climacteric states: Secondary | ICD-10-CM | POA: Diagnosis not present

## 2022-11-24 DIAGNOSIS — M24542 Contracture, left hand: Secondary | ICD-10-CM | POA: Diagnosis not present

## 2022-11-24 DIAGNOSIS — M25552 Pain in left hip: Secondary | ICD-10-CM | POA: Diagnosis not present

## 2022-11-24 DIAGNOSIS — M0579 Rheumatoid arthritis with rheumatoid factor of multiple sites without organ or systems involvement: Secondary | ICD-10-CM | POA: Diagnosis not present

## 2022-11-24 DIAGNOSIS — Z79899 Other long term (current) drug therapy: Secondary | ICD-10-CM | POA: Diagnosis not present

## 2022-12-01 DIAGNOSIS — M0589 Other rheumatoid arthritis with rheumatoid factor of multiple sites: Secondary | ICD-10-CM | POA: Diagnosis not present

## 2022-12-10 DIAGNOSIS — M21621 Bunionette of right foot: Secondary | ICD-10-CM | POA: Diagnosis not present

## 2022-12-10 DIAGNOSIS — M7751 Other enthesopathy of right foot: Secondary | ICD-10-CM | POA: Diagnosis not present

## 2022-12-10 NOTE — Progress Notes (Signed)
 Subjective:   Patient ID: Sharon Valenzuela, female   DOB: 52 y.o.   MRN: [id number]   HPI Patient presents with painful fifth digit right foot that is got fluid around it and makes it hard for her to wear shoe gear.  She has had history of bunion correction and patient does not smoke and does like to be active   Review of Systems  All other systems reviewed and are negative.       Objective:  Physical Exam Vitals and nursing note reviewed.  Constitutional:      Appearance: She is well-developed.  Pulmonary:     Effort: Pulmonary effort is normal.  Musculoskeletal:        General: Normal range of motion.  Skin:    General: Skin is warm.  Neurological:     Mental Status: She is alert.     Neurovascular status intact muscle strength was found to be adequate range of motion adequate with inflamed fifth digit right foot fluid buildup around the head of the proximal phalanx with lesion that is tender when pressed.  Also has other keratotic lesions bilateral with thick tight skin and incision sites healed first metatarsal bilateral.  Good digital perfusion well-oriented     Assessment:  Hammer toe deformity fifth digit right foot with inflammatory capsulitis inner phalangeal joint with lesion formation     Plan:  H&P x-rays reviewed discussed I went ahead did a careful block of the area I injected with 1/4 cc dexamethasone Kenalog I debrided the lesions discussed surgical arthroplasty and she wants to get this done but would like to wait till after the holiday season as she is a Scientist, research (medical).  Reappoint beginning of January.  This can be done in the office  X-ray does indicate rotation fifth digit right enlargement had a proximal phalanx with screw formation first metatarsal right secondary to previous surgery

## 2022-12-10 NOTE — Telephone Encounter (Signed)
 Pt called and was asking about possible surgery. She is thinking about maybe having surgery the week of christmas as she has the following week off of work already. She is wanting to know what days the week of christmas that you are possibly planning on doing surgery.   I did make her aware that she would have to have an appt to sign consent forms and be scheduled for the surgery.

## 2022-12-10 NOTE — Telephone Encounter (Signed)
 Should be that Tuesday before xmas

## 2022-12-11 NOTE — Telephone Encounter (Signed)
 Called pt and left message with a female to have pt call me back. Pt was in the middle of doing a hair relaxer and would call back.

## 2022-12-14 DIAGNOSIS — N951 Menopausal and female climacteric states: Secondary | ICD-10-CM | POA: Diagnosis not present

## 2022-12-17 DIAGNOSIS — M0520 Rheumatoid vasculitis with rheumatoid arthritis of unspecified site: Secondary | ICD-10-CM | POA: Diagnosis not present

## 2022-12-17 DIAGNOSIS — N951 Menopausal and female climacteric states: Secondary | ICD-10-CM | POA: Diagnosis not present

## 2022-12-17 DIAGNOSIS — Z6828 Body mass index (BMI) 28.0-28.9, adult: Secondary | ICD-10-CM | POA: Diagnosis not present

## 2022-12-17 DIAGNOSIS — F32A Depression, unspecified: Secondary | ICD-10-CM | POA: Diagnosis not present

## 2023-01-11 DIAGNOSIS — D72819 Decreased white blood cell count, unspecified: Secondary | ICD-10-CM | POA: Diagnosis not present

## 2023-01-11 DIAGNOSIS — M0589 Other rheumatoid arthritis with rheumatoid factor of multiple sites: Secondary | ICD-10-CM | POA: Diagnosis not present

## 2023-01-11 DIAGNOSIS — Z0000 Encounter for general adult medical examination without abnormal findings: Secondary | ICD-10-CM | POA: Diagnosis not present

## 2023-01-18 DIAGNOSIS — Z23 Encounter for immunization: Secondary | ICD-10-CM | POA: Diagnosis not present

## 2023-01-18 DIAGNOSIS — Z0000 Encounter for general adult medical examination without abnormal findings: Secondary | ICD-10-CM | POA: Diagnosis not present

## 2023-01-18 DIAGNOSIS — M0589 Other rheumatoid arthritis with rheumatoid factor of multiple sites: Secondary | ICD-10-CM | POA: Diagnosis not present

## 2023-01-18 DIAGNOSIS — F419 Anxiety disorder, unspecified: Secondary | ICD-10-CM | POA: Diagnosis not present

## 2023-01-18 DIAGNOSIS — D72819 Decreased white blood cell count, unspecified: Secondary | ICD-10-CM | POA: Diagnosis not present

## 2023-01-26 DIAGNOSIS — M0589 Other rheumatoid arthritis with rheumatoid factor of multiple sites: Secondary | ICD-10-CM | POA: Diagnosis not present

## 2023-02-08 DIAGNOSIS — M21619 Bunion of unspecified foot: Secondary | ICD-10-CM

## 2023-02-08 DIAGNOSIS — M2041 Other hammer toe(s) (acquired), right foot: Secondary | ICD-10-CM

## 2023-02-08 DIAGNOSIS — M21611 Bunion of right foot: Secondary | ICD-10-CM | POA: Diagnosis not present

## 2023-02-08 NOTE — Progress Notes (Signed)
 Subjective:   Patient ID: Sharon Valenzuela, female   DOB: 52 y.o.   MRN: [id number]   HPI Patient presents stating that the right fifth toe is really bothering her and making it hard to wear shoe gear and she wants to have it fixed neuro vas   ROS      Objective:  Physical Exam  Scaler status intact with inflammation around the head of the proximal phalanx digit 5 right keratotic lesion formation that is doing some better but still sore and gradually becoming worse     Assessment:  Chronic hammertoe deformity digit 5 right that is continuing to be a problem and it did not respond well conservatively     Plan:  H&P reviewed we both agree that arthroplasty will be the best long-term solution and she is going to have this done in the office.  I allowed her to go over consent form discussing conservative treatment and alternative treatments complications and she wants surgery signed consent form scheduled for outpatient surgery.  All questions answered today this will be done on an outpatient office and will be scheduled in the next month  X-rays indicate good rotational component to the toe elongation but no indications of osteolysis

## 2023-02-15 DIAGNOSIS — M25562 Pain in left knee: Secondary | ICD-10-CM | POA: Diagnosis not present

## 2023-02-15 DIAGNOSIS — M25561 Pain in right knee: Secondary | ICD-10-CM | POA: Diagnosis not present

## 2023-02-18 DIAGNOSIS — M069 Rheumatoid arthritis, unspecified: Secondary | ICD-10-CM | POA: Diagnosis not present

## 2023-02-18 DIAGNOSIS — F329 Major depressive disorder, single episode, unspecified: Secondary | ICD-10-CM | POA: Diagnosis not present

## 2023-02-18 DIAGNOSIS — N898 Other specified noninflammatory disorders of vagina: Secondary | ICD-10-CM | POA: Diagnosis not present

## 2023-02-18 DIAGNOSIS — N951 Menopausal and female climacteric states: Secondary | ICD-10-CM | POA: Diagnosis not present

## 2023-02-23 DIAGNOSIS — Z1231 Encounter for screening mammogram for malignant neoplasm of breast: Secondary | ICD-10-CM | POA: Diagnosis not present

## 2023-02-24 NOTE — Telephone Encounter (Signed)
 DOS-03/12/23   [facility] REPAIR- [id number]  [employer] EFFECTIVE DATE-02/10/23  DEDUCTIBLE-$1500.00 WITH REMAINING $1500.00 OOP-$7800.00 WITH REMAINING $7571.90 COINSURANCE- 25%  POST A PEER TO PEER BETWEEN DR.REGAL AND DR.Belvie Boyers MD), DR.SALTZMAN STATED THAT PRIOR AUTH HAS BEEN APPROVED FOR CPT CODE 14782. GOOD FROM 03/12/23 - 05/10/23

## 2023-02-24 NOTE — Telephone Encounter (Signed)
 Pt called stating that she needs to cxl her office surgery do to the cost she would have to pay. Pt stated that she may reschedule once her deductible is met.

## 2023-03-01 NOTE — Telephone Encounter (Signed)
 Should be fully covered as office procedure

## 2023-03-12 DIAGNOSIS — M2041 Other hammer toe(s) (acquired), right foot: Secondary | ICD-10-CM

## 2023-03-12 NOTE — Progress Notes (Signed)
 Subjective:   Patient ID: Sharon Valenzuela, female   DOB: 53 y.o.   MRN: [id number]   HPI Patient presents with a very painful chronic corn fifth digit right that is not responded to aggressive treatment and other modalities   ROS      Objective:  Physical Exam  Neurovascular status intact good digital perfusion severe keratotic lesion digit 5 right with rotated digit     Assessment:  Chronic hammertoe deformity digit 5 right with lesion     Plan:  Patient was anesthetized 60 mg like Marcaine mixture and brought to the OR.  Sterile prep applied to the right foot and right ankle tourniquet inflated to 250 mmHg and the following procedure was performed.  Arthroplasty digit 5 right attention was directed to the dorsal aspect digit 5 right where a semielliptical incision was made encompassing a chronic lesion with the skin wedge removed in toto.  The wound was then identified the proximal interphalangeal joint was identified and the ligaments to the joint were severed.  The head of the proximal phalanx was delivered from the wound and resected in toto the wound was flushed copious amounts of sterile Garamycin solution and then no other abnormal bone was noted and sutured with 5-0 nylon.  Sterile dressing applied tourniquet released capillary fill immediate to the toe surgical shoe dispensed and patient left the OR in satisfactory condition and has a prescription for hydrocodone for postoperative discomfort.  Reappoint 1 week or earlier if needed

## 2023-03-15 DIAGNOSIS — M2041 Other hammer toe(s) (acquired), right foot: Secondary | ICD-10-CM | POA: Diagnosis not present

## 2023-03-15 NOTE — Progress Notes (Signed)
 Subjective:   Patient ID: Sharon Valenzuela, female   DOB: 53 y.o.   MRN: [id number]   HPI Patient presents stating she bumped the fifth digit on her right foot and she is concerned and saw it was bleeding   ROS      Objective:  Physical Exam  Neurovascular status intact negative Denna Haggard' sign noted fifth digit good alignment the stitches are intact wound edges coapted well slight bleeding from trauma     Assessment:  Traumatized fifth digit right appears to be doing well clinically     Plan:  H&P and precautionary x-ray indicated no bone pathology.  Reapplied sterile dressing and will reappoint 2 weeks suture removal earlier if needed  X-ray indicates satisfactory section had a proximal phalanx digit 5 right good alignment

## 2023-03-23 DIAGNOSIS — M0589 Other rheumatoid arthritis with rheumatoid factor of multiple sites: Secondary | ICD-10-CM | POA: Diagnosis not present

## 2023-03-23 DIAGNOSIS — Z79899 Other long term (current) drug therapy: Secondary | ICD-10-CM | POA: Diagnosis not present

## 2023-03-25 DIAGNOSIS — Z124 Encounter for screening for malignant neoplasm of cervix: Secondary | ICD-10-CM | POA: Diagnosis not present

## 2023-03-25 DIAGNOSIS — Z01419 Encounter for gynecological examination (general) (routine) without abnormal findings: Secondary | ICD-10-CM | POA: Diagnosis not present

## 2023-03-25 DIAGNOSIS — Z1331 Encounter for screening for depression: Secondary | ICD-10-CM | POA: Diagnosis not present

## 2023-03-29 DIAGNOSIS — M2041 Other hammer toe(s) (acquired), right foot: Secondary | ICD-10-CM

## 2023-03-29 DIAGNOSIS — M21622 Bunionette of left foot: Secondary | ICD-10-CM

## 2023-03-29 DIAGNOSIS — Z9889 Other specified postprocedural states: Secondary | ICD-10-CM

## 2023-03-29 NOTE — Progress Notes (Signed)
 Subjective:   Patient ID: Sharon Valenzuela, female   DOB: 53 y.o.   MRN: [id number]   HPI Patient states doing very well pleased with surgery   ROS      Objective:  Physical Exam  Neurovascular status intact negative Denna Haggard' sign noted wound edges coapted well digits good alignment     Assessment:  Doing well post arthroplasty digit 5 right     Plan:  H&P x-rays taken stitches removed wound edges coapted well applied compression gradual return to shoe gear over the next few weeks  X-rays indicate satisfactory resection of bone digit 5 right good alignment noted

## 2023-04-12 DIAGNOSIS — Z23 Encounter for immunization: Secondary | ICD-10-CM | POA: Diagnosis not present

## 2023-05-17 DIAGNOSIS — N951 Menopausal and female climacteric states: Secondary | ICD-10-CM | POA: Diagnosis not present

## 2023-05-18 DIAGNOSIS — M0589 Other rheumatoid arthritis with rheumatoid factor of multiple sites: Secondary | ICD-10-CM | POA: Diagnosis not present

## 2023-05-20 DIAGNOSIS — M069 Rheumatoid arthritis, unspecified: Secondary | ICD-10-CM | POA: Diagnosis not present

## 2023-05-20 DIAGNOSIS — N951 Menopausal and female climacteric states: Secondary | ICD-10-CM | POA: Diagnosis not present

## 2023-05-20 DIAGNOSIS — F32A Depression, unspecified: Secondary | ICD-10-CM | POA: Diagnosis not present

## 2023-05-31 DIAGNOSIS — M24542 Contracture, left hand: Secondary | ICD-10-CM | POA: Diagnosis not present

## 2023-05-31 DIAGNOSIS — M0579 Rheumatoid arthritis with rheumatoid factor of multiple sites without organ or systems involvement: Secondary | ICD-10-CM | POA: Diagnosis not present

## 2023-05-31 DIAGNOSIS — M25552 Pain in left hip: Secondary | ICD-10-CM | POA: Diagnosis not present

## 2023-05-31 DIAGNOSIS — Z79899 Other long term (current) drug therapy: Secondary | ICD-10-CM | POA: Diagnosis not present

## 2023-06-14 DIAGNOSIS — L910 Hypertrophic scar: Secondary | ICD-10-CM | POA: Diagnosis not present

## 2023-06-14 DIAGNOSIS — L810 Postinflammatory hyperpigmentation: Secondary | ICD-10-CM | POA: Diagnosis not present

## 2023-06-14 DIAGNOSIS — L700 Acne vulgaris: Secondary | ICD-10-CM | POA: Diagnosis not present
